# Patient Record
Sex: Male | Born: 2000 | Race: White | Hispanic: No | Marital: Single | Smoking: Never smoker
Health system: Southern US, Community
[De-identification: ages and names within clinical notes are randomized; demographics above are authoritative.]

## PROBLEM LIST (undated history)

## (undated) DIAGNOSIS — L0231 Cutaneous abscess of buttock: Secondary | ICD-10-CM

## (undated) DIAGNOSIS — R5600 Simple febrile convulsions: Secondary | ICD-10-CM

## (undated) DIAGNOSIS — J45909 Unspecified asthma, uncomplicated: Secondary | ICD-10-CM

---
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
## 2022-11-03 ENCOUNTER — Ambulatory Visit (INDEPENDENT_AMBULATORY_CARE_PROVIDER_SITE_OTHER): Payer: BC Managed Care – PPO | Admitting: Family Medicine

## 2022-11-03 ENCOUNTER — Ambulatory Visit
Admission: RE | Admit: 2022-11-03 | Discharge: 2022-11-03 | Disposition: A | Discharge status: Home / Self Care | Payer: BC Managed Care – PPO | Source: Ambulatory Visit | Attending: Family Medicine | Admitting: Family Medicine

## 2022-11-03 VITALS — BP 110/78 | Ht 76.0 in | Wt 174.0 lb

## 2022-11-03 DIAGNOSIS — M79671 Pain in right foot: Secondary | ICD-10-CM | POA: Diagnosis not present

## 2022-11-03 NOTE — Patient Instructions (Signed)
Get x-rays after you leave today. If these are normal we will go ahead with an MRI of your foot to assess for a navicular stress fracture vs acute fracture. No running in the meantime. Consider cam walker when up and walking around - if pain is severe, crutches. Icing 15 minutes at a time as needed. Ibuprofen or tylenol if needed for pain. For your hip pain do hip abduction, external rotation exercises 3 sets of 10 daily.

## 2022-11-03 NOTE — Progress Notes (Unsigned)
PCP: Elliot Gurney., MD  Subjective:   HPI: Patient is a 22 y.o. male here for right foot pain.  Rolled right ankle while playing basketball 2-3 months ago. Swelling and pain of the medial foot after this, but initially improved with rest. Did not seek medical care at that time.  Started running and working at [employer] in the last month. Is on his feet all day lately. Pain returned with swelling when running and walking. Runs 10-15 miles weekly. Pain had not limited walking or running. Was planing to start training for half marathon but is concerned about worsening foot pain.  Past Medical History:  Diagnosis Date   Asthma    Febrile seizure (HCC)    Gluteal abscess     Current Outpatient Medications on File Prior to Visit  Medication Sig Dispense Refill   albuterol (PROVENTIL HFA;VENTOLIN HFA) 108 (90 BASE) MCG/ACT inhaler Inhale 2 puffs into the lungs every 6 (six) hours as needed for wheezing.     EPINEPHrine (EPIPEN JR) 0.15 MG/0.3ML injection Inject into the muscle.     No current facility-administered medications on file prior to visit.    No past surgical history on file.  Allergies  Allergen Reactions   Other     Tree nuts     BP 110/78   Ht 6\' 4"  (1.93 m)   Wt 174 lb (78.9 kg)   BMI 21.18 kg/m       No data to display              No data to display              Objective:  Physical Exam:  Gen: NAD, comfortable in exam room  Right foot Prominence of the R navicular with mild swelling and TTP No ecchymosis.  Normal ROM with mild pain with inversion of the foot.  No tenderness over TMTs, no laxity with inversion of the foot NV intact   Assessment & Plan:  1. Right foot pain - Navicular pain concerning for acute vs stress fracture. Will obtain foot xray today. Advised patient to refrain from walking. Conservative measures for pain. If pain is worsening may benefit from CAM walker and crutches.

## 2022-11-04 ENCOUNTER — Other Ambulatory Visit: Payer: Self-pay

## 2022-11-04 ENCOUNTER — Encounter: Payer: Self-pay | Admitting: Family Medicine

## 2022-11-04 DIAGNOSIS — M79671 Pain in right foot: Secondary | ICD-10-CM

## 2022-12-17 ENCOUNTER — Ambulatory Visit
Admission: RE | Admit: 2022-12-17 | Discharge: 2022-12-17 | Disposition: A | Discharge status: Home / Self Care | Payer: BC Managed Care – PPO | Source: Ambulatory Visit | Attending: Family Medicine | Admitting: Family Medicine

## 2022-12-17 DIAGNOSIS — M79671 Pain in right foot: Secondary | ICD-10-CM

## 2023-01-16 ENCOUNTER — Encounter: Payer: Self-pay | Admitting: Family Medicine

## 2023-04-13 DIAGNOSIS — L309 Dermatitis, unspecified: Secondary | ICD-10-CM | POA: Diagnosis not present

## 2023-07-24 DIAGNOSIS — F99 Mental disorder, not otherwise specified: Secondary | ICD-10-CM | POA: Diagnosis not present

## 2023-07-24 DIAGNOSIS — F5105 Insomnia due to other mental disorder: Secondary | ICD-10-CM | POA: Diagnosis not present

## 2023-07-24 DIAGNOSIS — F422 Mixed obsessional thoughts and acts: Secondary | ICD-10-CM | POA: Diagnosis not present

## 2023-07-24 DIAGNOSIS — F411 Generalized anxiety disorder: Secondary | ICD-10-CM | POA: Diagnosis not present

## 2023-07-24 DIAGNOSIS — R4681 Obsessive-compulsive behavior: Secondary | ICD-10-CM | POA: Diagnosis not present
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
## 2023-08-21 DIAGNOSIS — F422 Mixed obsessional thoughts and acts: Secondary | ICD-10-CM | POA: Diagnosis not present

## 2023-08-21 DIAGNOSIS — F39 Unspecified mood [affective] disorder: Secondary | ICD-10-CM | POA: Diagnosis not present

## 2023-08-21 DIAGNOSIS — Z79899 Other long term (current) drug therapy: Secondary | ICD-10-CM | POA: Diagnosis not present

## 2023-08-21 DIAGNOSIS — F411 Generalized anxiety disorder: Secondary | ICD-10-CM | POA: Diagnosis not present

## 2023-10-05 DIAGNOSIS — S63632A Sprain of interphalangeal joint of right middle finger, initial encounter: Secondary | ICD-10-CM | POA: Diagnosis not present

## 2024-01-02 ENCOUNTER — Emergency Department (HOSPITAL_BASED_OUTPATIENT_CLINIC_OR_DEPARTMENT_OTHER)
Admission: EM | Admit: 2024-01-02 | Discharge: 2024-01-03 | Disposition: A | Discharge status: Home / Self Care | Attending: Emergency Medicine | Admitting: Emergency Medicine

## 2024-01-02 ENCOUNTER — Other Ambulatory Visit: Payer: Self-pay

## 2024-01-02 ENCOUNTER — Emergency Department (HOSPITAL_BASED_OUTPATIENT_CLINIC_OR_DEPARTMENT_OTHER): Admitting: Radiology

## 2024-01-02 ENCOUNTER — Ambulatory Visit
Admission: EM | Admit: 2024-01-02 | Discharge: 2024-01-02 | Disposition: A | Discharge status: Home / Self Care | Attending: Family Medicine | Admitting: Family Medicine

## 2024-01-02 ENCOUNTER — Encounter (HOSPITAL_BASED_OUTPATIENT_CLINIC_OR_DEPARTMENT_OTHER): Payer: Self-pay

## 2024-01-02 DIAGNOSIS — J45909 Unspecified asthma, uncomplicated: Secondary | ICD-10-CM | POA: Diagnosis not present

## 2024-01-02 DIAGNOSIS — R0789 Other chest pain: Secondary | ICD-10-CM | POA: Diagnosis not present

## 2024-01-02 DIAGNOSIS — R002 Palpitations: Secondary | ICD-10-CM | POA: Diagnosis not present

## 2024-01-02 DIAGNOSIS — R079 Chest pain, unspecified: Secondary | ICD-10-CM | POA: Diagnosis not present

## 2024-01-02 LAB — BASIC METABOLIC PANEL WITH GFR
Anion gap: 13 (ref 5–15)
BUN: 14 mg/dL (ref 6–20)
CO2: 22 mmol/L (ref 22–32)
Calcium: 9.6 mg/dL (ref 8.9–10.3)
Chloride: 102 mmol/L (ref 98–111)
Creatinine, Ser: 1.15 mg/dL (ref 0.61–1.24)
GFR, Estimated: >60 mL/min (ref >60)
Glucose, Bld: 106 mg/dL — ABNORMAL HIGH (ref 70–99)
Potassium: 4 mmol/L (ref 3.5–5.1)
Sodium: 137 mmol/L (ref 135–145)

## 2024-01-02 LAB — CBC
HCT: 46.1 % (ref 39.0–52.0)
Hemoglobin: 16.2 g/dL (ref 13.0–17.0)
MCH: 29.3 pg (ref 26.0–34.0)
MCHC: 35.1 g/dL (ref 30.0–36.0)
MCV: 83.5 fL (ref 80.0–100.0)
Platelets: 270 K/uL (ref 150–400)
RBC: 5.52 MIL/uL (ref 4.22–5.81)
RDW: 12.2 % (ref 11.5–15.5)
WBC: 11.4 K/uL — ABNORMAL HIGH (ref 4.0–10.5)
nRBC: 0 % (ref 0.0–0.2)

## 2024-01-02 LAB — TROPONIN T, HIGH SENSITIVITY: Troponin T High Sensitivity: <15 ng/L (ref 0–19)

## 2024-01-02 NOTE — ED Triage Notes (Signed)
 Pt reports he is here today due to chest pain/ palpations.Pt reports it started around noon.Pt states that he went to [facility] for the same earlier today with no relief. Pt reports chest pain worsening.
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
## 2024-01-02 NOTE — ED Provider Notes (Signed)
 [place] [facility] AT [facility]  Provider Note  CSN: [id number] Arrival date & time: 01/02/24 2124  History Chief Complaint  Patient presents with   Chest Pain    Steven Peterson is a 23 y.o. male with history of asthma, anxiety, OCD, but no other significant medical problems reports an episode of CP started around noon today. Lasted for an hour or so, seen at [facility] where EKG showed RSR' but otherwise normal. He was ultimately discharged from there and went home where pain returned a few hours later prompting his ED visit. He has otherwise been feeling well recently other than some muscle soreness associated with working out earlier this week. Denies leg swelling or long distance travel.    Home Medications Prior to Admission medications   Medication Sig Start Date End Date Taking? Authorizing Provider  albuterol  (PROVENTIL  HFA;VENTOLIN  HFA) 108 (90 BASE) MCG/ACT inhaler Inhale 2 puffs into the lungs every 6 (six) hours as needed for wheezing.    [provider]  EPINEPHrine  (EPIPEN  JR) 0.15 MG/0.3ML injection Inject into the muscle.    [provider]  sertraline (ZOLOFT) 100 MG tablet Take 200 mg by mouth daily.    [provider]     Allergies    Mupirocin and Other   Review of Systems   Review of Systems Please see HPI for pertinent positives and negatives  Physical Exam BP 133/83   Pulse 73   Temp 98 F (36.7 C) (Oral)   Resp 17   SpO2 99%   Physical Exam Vitals and nursing note reviewed.  Constitutional:      Appearance: Normal appearance.  HENT:     Head: Normocephalic and atraumatic.     Nose: Nose normal.     Mouth/Throat:     Mouth: Mucous membranes are moist.  Eyes:     Extraocular Movements: Extraocular movements intact.     Conjunctiva/sclera: Conjunctivae normal.  Cardiovascular:     Rate and Rhythm: Normal rate.  Pulmonary:     Effort: Pulmonary effort is normal.     Breath sounds: Normal breath  sounds.  Chest:     Chest wall: No tenderness.  Abdominal:     General: Abdomen is flat.     Palpations: Abdomen is soft.     Tenderness: There is no abdominal tenderness.  Musculoskeletal:        General: No swelling. Normal range of motion.     Cervical back: Neck supple.     Right lower leg: No edema.     Left lower leg: No edema.  Skin:    General: Skin is warm and dry.  Neurological:     General: No focal deficit present.     Mental Status: He is alert.  Psychiatric:        Mood and Affect: Mood normal.     ED Results / Procedures / Treatments   EKG EKG Interpretation Date/Time:  Saturday January 02 2024 21:35:07 EDT Ventricular Rate:  78 PR Interval:  152 QRS Duration:  92 QT Interval:  356 QTC Calculation: 405 R Axis:   88  Text Interpretation: Normal sinus rhythm Normal ECG When compared with ECG of 02-Jan-2024 15:15, RSR' pattern in V1 is no longer Present Confirmed by Roselyn Dunnings [phone]) on 01/02/2024 11:37:08 PM  Procedures Procedures  Medications Ordered in the ED Medications - No data to display  Initial Impression and Plan  Patient with low risk factor profile for ACS here with atypical  chest pains. Exam and vitals are reassuring. No concern for PE, PTX, PNA, etc. Labs done in triage show unremarkable CBC, BMP and Trop. I personally viewed the images from radiology studies and agree with radiologist interpretation: CXR is clear. Will check delta trop given return of pain this evening. Patient reassured no apparently emergent cause of his pain at this point.   ED Course   Clinical Course as of 01/03/24 0044  Sun Jan 03, 2024  0043 Delta trop remains normal. Patient advised to rest, avoid strenuous activity, APAP/Motrin as needed for pain. PCP follow up, RTED for any other concerns.   [CS]    Clinical Course User Index [CS] Roselyn Carlin NOVAK, MD     MDM Rules/Calculators/A&P Medical Decision Making Problems Addressed: Nonspecific chest pain:  acute illness or injury  Amount and/or Complexity of Data Reviewed Labs: ordered. Decision-making details documented in ED Course. Radiology: ordered and independent interpretation performed. Decision-making details documented in ED Course. ECG/medicine tests: ordered and independent interpretation performed. Decision-making details documented in ED Course.  Risk OTC drugs.     Final Clinical Impression(s) / ED Diagnoses Final diagnoses:  Nonspecific chest pain    Rx / DC Orders ED Discharge Orders     None        Roselyn Carlin NOVAK, MD 01/03/24 [phone]

## 2024-01-02 NOTE — ED Triage Notes (Signed)
 Pt c/o left side CP that radiates to LUE and left side of neck x 2 hours-no pain meds PTA-also c/o weird muscle aches after workouts this week-NAD-steady gait

## 2024-01-03 LAB — TROPONIN T, HIGH SENSITIVITY: Troponin T High Sensitivity: <15 ng/L (ref 0–19)

## 2024-01-22 DIAGNOSIS — G8929 Other chronic pain: Secondary | ICD-10-CM | POA: Diagnosis not present

## 2024-01-22 DIAGNOSIS — M545 Low back pain, unspecified: Secondary | ICD-10-CM | POA: Diagnosis not present

## 2024-01-26 DIAGNOSIS — M9902 Segmental and somatic dysfunction of thoracic region: Secondary | ICD-10-CM | POA: Diagnosis not present

## 2024-01-26 DIAGNOSIS — M5459 Other low back pain: Secondary | ICD-10-CM | POA: Diagnosis not present

## 2024-01-26 DIAGNOSIS — M9903 Segmental and somatic dysfunction of lumbar region: Secondary | ICD-10-CM | POA: Diagnosis not present

## 2024-01-26 DIAGNOSIS — M546 Pain in thoracic spine: Secondary | ICD-10-CM | POA: Diagnosis not present

## 2024-03-29 DIAGNOSIS — R079 Chest pain, unspecified: Secondary | ICD-10-CM | POA: Diagnosis not present

## 2024-03-29 DIAGNOSIS — Z79899 Other long term (current) drug therapy: Secondary | ICD-10-CM | POA: Diagnosis not present

## 2024-03-29 DIAGNOSIS — R059 Cough, unspecified: Secondary | ICD-10-CM | POA: Diagnosis not present

## 2024-03-29 DIAGNOSIS — R0602 Shortness of breath: Secondary | ICD-10-CM | POA: Diagnosis not present

## 2024-03-29 DIAGNOSIS — Z91018 Allergy to other foods: Secondary | ICD-10-CM | POA: Diagnosis not present

## 2024-03-29 DIAGNOSIS — R0981 Nasal congestion: Secondary | ICD-10-CM | POA: Diagnosis not present

## 2024-03-29 DIAGNOSIS — J45901 Unspecified asthma with (acute) exacerbation: Secondary | ICD-10-CM | POA: Diagnosis not present

## 2024-03-29 DIAGNOSIS — Z7952 Long term (current) use of systemic steroids: Secondary | ICD-10-CM | POA: Diagnosis not present
